# Patient Record
Sex: Female | Born: 1985 | Race: White | Hispanic: No | Marital: Single | Smoking: Current every day smoker
Health system: Southern US, Community
[De-identification: ages and names within clinical notes are randomized; demographics above are authoritative.]

---

## 2019-04-26 ENCOUNTER — Other Ambulatory Visit: Payer: Self-pay

## 2019-04-26 ENCOUNTER — Emergency Department
Admission: EM | Admit: 2019-04-26 | Discharge: 2019-04-26 | Disposition: A | Discharge status: Home / Self Care | Payer: MEDICAID | Attending: Emergency Medicine | Admitting: Emergency Medicine

## 2019-04-26 ENCOUNTER — Emergency Department: Payer: MEDICAID

## 2019-04-26 DIAGNOSIS — F1910 Other psychoactive substance abuse, uncomplicated: Secondary | ICD-10-CM | POA: Insufficient documentation

## 2019-04-26 DIAGNOSIS — F17210 Nicotine dependence, cigarettes, uncomplicated: Secondary | ICD-10-CM | POA: Diagnosis not present

## 2019-04-26 DIAGNOSIS — T50901A Poisoning by unspecified drugs, medicaments and biological substances, accidental (unintentional), initial encounter: Secondary | ICD-10-CM | POA: Diagnosis present

## 2019-04-26 LAB — COMPREHENSIVE METABOLIC PANEL
ALT: 22 U/L (ref 0–44)
AST: 27 U/L (ref 15–41)
Albumin: 4.3 g/dL (ref 3.5–5.0)
Alkaline Phosphatase: 48 U/L (ref 38–126)
Anion gap: 6 (ref 5–15)
BUN: 11 mg/dL (ref 6–20)
CO2: 27 mmol/L (ref 22–32)
Calcium: 9.3 mg/dL (ref 8.9–10.3)
Chloride: 103 mmol/L (ref 98–111)
Creatinine, Ser: 0.62 mg/dL (ref 0.44–1.00)
GFR calc Af Amer: >60 mL/min (ref >60)
GFR calc non Af Amer: >60 mL/min (ref >60)
Glucose, Bld: 94 mg/dL (ref 70–99)
Potassium: 3.9 mmol/L (ref 3.5–5.1)
Sodium: 136 mmol/L (ref 135–145)
Total Bilirubin: 0.8 mg/dL (ref 0.3–1.2)
Total Protein: 7.2 g/dL (ref 6.5–8.1)

## 2019-04-26 LAB — CBC
HCT: 39.2 % (ref 36.0–46.0)
Hemoglobin: 13.5 g/dL (ref 12.0–15.0)
MCH: 34.7 pg — ABNORMAL HIGH (ref 26.0–34.0)
MCHC: 34.4 g/dL (ref 30.0–36.0)
MCV: 100.8 fL — ABNORMAL HIGH (ref 80.0–100.0)
Platelets: 273 10*3/uL (ref 150–400)
RBC: 3.89 MIL/uL (ref 3.87–5.11)
RDW: 10.7 % — ABNORMAL LOW (ref 11.5–15.5)
WBC: 11 10*3/uL — ABNORMAL HIGH (ref 4.0–10.5)
nRBC: 0 % (ref 0.0–0.2)

## 2019-04-26 LAB — PREGNANCY, URINE: Preg Test, Ur: NEGATIVE

## 2019-04-26 LAB — URINE DRUG SCREEN, QUALITATIVE (ARMC ONLY)
Amphetamines, Ur Screen: NOT DETECTED
Barbiturates, Ur Screen: NOT DETECTED
Benzodiazepine, Ur Scrn: POSITIVE — AB
Cannabinoid 50 Ng, Ur ~~LOC~~: POSITIVE — AB
Cocaine Metabolite,Ur ~~LOC~~: POSITIVE — AB
MDMA (Ecstasy)Ur Screen: NOT DETECTED
Methadone Scn, Ur: NOT DETECTED
Opiate, Ur Screen: NOT DETECTED
Phencyclidine (PCP) Ur S: NOT DETECTED
Tricyclic, Ur Screen: NOT DETECTED

## 2019-04-26 LAB — ETHANOL: Alcohol, Ethyl (B): <10 mg/dL (ref <10)

## 2019-04-26 MED ORDER — ALBUTEROL SULFATE HFA 108 (90 BASE) MCG/ACT IN AERS: 2.0000 | INHALATION_SPRAY | Freq: Four times a day (QID) | RESPIRATORY_TRACT | 1 refills | Status: AC | PRN

## 2019-04-26 NOTE — ED Notes (Signed)
Sent a rainbow to the lab. 

## 2019-04-26 NOTE — ED Triage Notes (Addendum)
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 Pt is alert on arrival. Pt admits to taking percocet, xanax and crack cocaine. 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

## 2019-04-26 NOTE — ED Provider Notes (Signed)
[facility] Emergency Department Provider Note       Time seen: ----------------------------------------- 1:11 PM on 04/26/2019 -----------------------------------------   I have reviewed the triage vital signs and the nursing notes.  HISTORY   Chief Complaint Drug Problem   HPI Lindsay Oliver is a 34 y.o. female with a history of drug abuse who presents to the ED for accidental overdose.  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  She was given 2 mg of intranasal Narcan which resulted in her waking back up.  She also admits to using Percocet, Xanax and crack cocaine.  History reviewed. No pertinent past medical history.  There are no problems to display for this patient.   History reviewed. No pertinent surgical history.  Allergies Patient has no known allergies.  Social History Social History   Tobacco Use  . Smoking status: Current Every Day Smoker    Types: Cigarettes  . Smokeless tobacco: Never Used  Substance Use Topics  . Alcohol use: Yes  . Drug use: Yes    Types: Cocaine    Review of Systems Constitutional: Negative for fever. Cardiovascular: Negative for chest pain. Respiratory: Positive for shortness of breath and cough Gastrointestinal: Negative for abdominal pain, vomiting and diarrhea. Musculoskeletal: Negative for back pain. Skin: Negative for rash. Neurological: Negative for headaches, focal weakness or numbness. Psychiatric: Negative for suicidal or homicidal ideation, positive for drug abuse  All systems negative/normal/unremarkable except as stated in the HPI  ____________________________________________   PHYSICAL EXAM:  VITAL SIGNS: ED Triage Vitals  Enc Vitals Group     BP 04/26/19 1038 127/89     Pulse Rate 04/26/19 1038 75     Resp 04/26/19 1038 16     Temp 04/26/19 1038 (!) 97.5 F (36.4 C)     Temp Source 04/26/19 1038 Oral     SpO2  04/26/19 1038 100 %     Weight 04/26/19 1039 120 lb (54.4 kg)     Height 04/26/19 1039 4\' 11"  (1.499 m)     Head Circumference --      Peak Flow --      Pain Score 04/26/19 1039 0     Pain Loc --      Pain Edu? --      Excl. in GC? --     Constitutional: Alert and oriented. Well appearing and in no distress. Eyes: Conjunctivae are normal. Normal extraocular movements. Cardiovascular: Normal rate, regular rhythm. No murmurs, rubs, or gallops. Respiratory: Normal respiratory effort without tachypnea nor retractions. Breath sounds are clear and equal bilaterally. No wheezes/rales/rhonchi. Gastrointestinal: Soft and nontender. Normal bowel sounds Musculoskeletal: Nontender with normal range of motion in extremities. No lower extremity tenderness nor edema. Neurologic:  Normal speech and language. No gross focal neurologic deficits are appreciated.  Skin:  Skin is warm, dry and intact. No rash noted. Psychiatric: Mood and affect are normal. Speech and behavior are normal.  ____________________________________________  ED COURSE:  As part of my medical decision making, I reviewed the following data within the electronic MEDICAL RECORD NUMBER History obtained from family if available, nursing notes, old chart and ekg, as well as notes from prior ED visits. Patient presented for an accidental overdose, we will assess with labs and imaging as indicated at this time.   Procedures  Lindsay Oliver was evaluated in Emergency Department on 04/26/2019 for the symptoms described in the history of present illness. She was evaluated  in the context of the global COVID-19 pandemic, which necessitated consideration that the patient might be at risk for infection with the SARS-CoV-2 virus that causes COVID-19. Institutional protocols and algorithms that pertain to the evaluation of patients at risk for COVID-19 are in a state of rapid change based on information released by regulatory bodies including the CDC  and federal and state organizations. These policies and algorithms were followed during the patient's care in the ED.  ____________________________________________   LABS (pertinent positives/negatives)  Labs Reviewed  CBC - Abnormal; Notable for the following components:      Result Value   WBC 11.0 (*)    MCV 100.8 (*)    MCH 34.7 (*)    RDW 10.7 (*)    All other components within normal limits  URINE DRUG SCREEN, QUALITATIVE ([facility] ONLY) - Abnormal; Notable for the following components:   Cocaine Metabolite,Ur [place] POSITIVE (*)    Cannabinoid 50 Ng, Ur [place] POSITIVE (*)    Benzodiazepine, Ur [place] POSITIVE (*)    All other components within normal limits  COMPREHENSIVE METABOLIC PANEL  ETHANOL  PREGNANCY, URINE    RADIOLOGY Images were viewed by me  Chest x-ray IMPRESSION:  No active disease.  ____________________________________________   DIFFERENTIAL DIAGNOSIS   Accidental overdose, aspiration, dehydration, electrolyte abnormality  FINAL ASSESSMENT AND PLAN  Substance abuse   Plan: The patient had presented for substance use disorder and accidental overdose. Patient's labs are as expected with benzodiazepines, marijuana and cocaine and her tox screen. Patient's imaging not reveal any acute process.  She was complaining of intermittent shortness of breath for which I will prescribe an albuterol inhaler.  She has declined going to detox at this time.   Laurence Aly, MD    Note: This note was generated in part or whole with voice recognition software. Voice recognition is usually quite accurate but there are transcription errors that can and very often do occur. I apologize for any typographical errors that were not detected and corrected.     Earleen Newport, MD 04/26/19 [phone]

## 2020-12-25 IMAGING — DX DG CHEST 1V
1 series · 1 of 1 positions shown · non-contrast
Comparison: None.

CLINICAL DATA: Dyspnea, cough.

EXAM:
CHEST  1 VIEW

[chest ap]
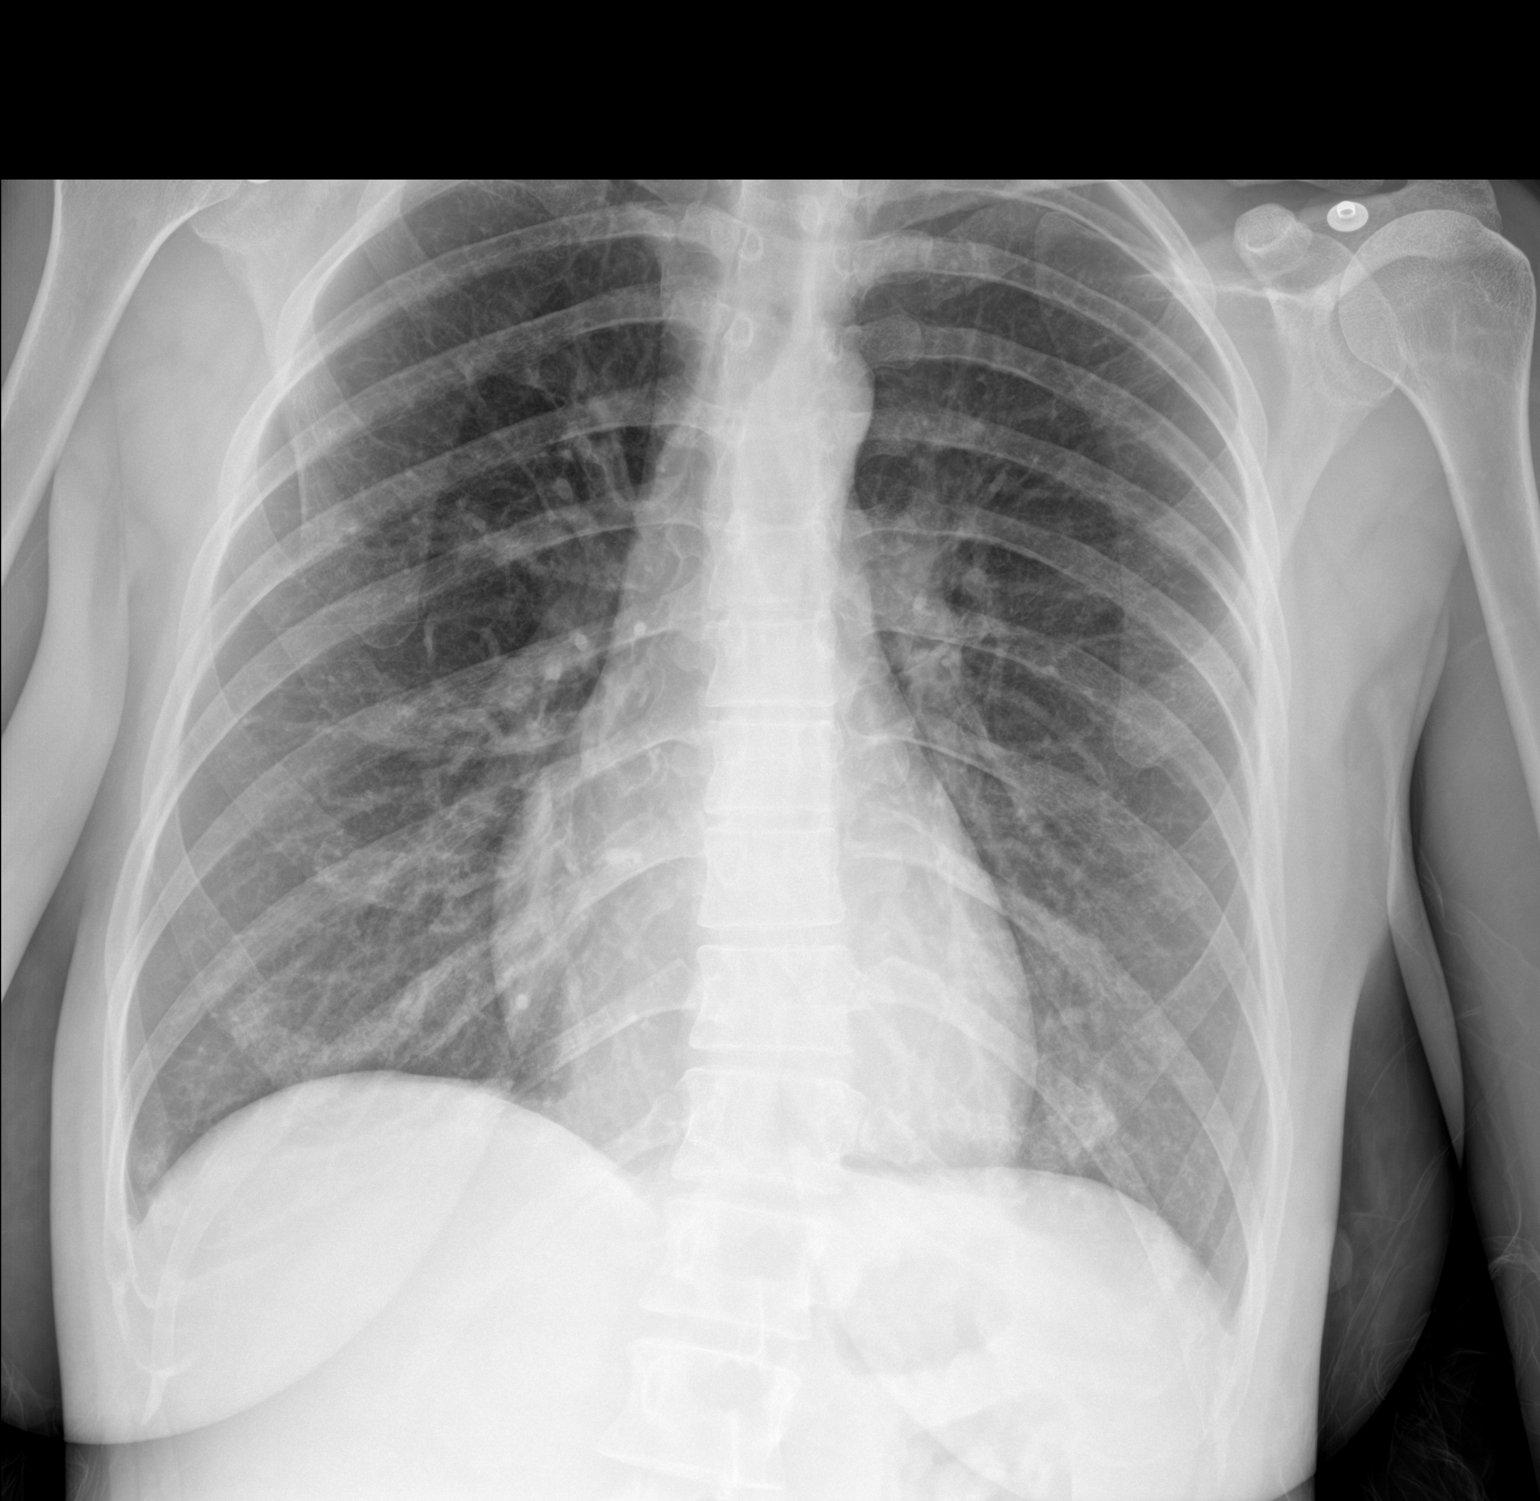

[1 of 1 positions shown; findings below may reference images not displayed]

FINDINGS: The heart size and mediastinal contours are within normal limits.
Both lungs are clear. No pneumothorax or pleural effusion is noted.
The visualized skeletal structures are unremarkable.
IMPRESSION: No active disease.
